# Patient Record
Sex: Female | Born: 1975 | Race: White | Hispanic: No | Marital: Married | Smoking: Never smoker
Health system: Southern US, Community
[De-identification: ages and names within clinical notes are randomized; demographics above are authoritative.]

## PROBLEM LIST (undated history)

## (undated) DIAGNOSIS — Z8719 Personal history of other diseases of the digestive system: Secondary | ICD-10-CM

## (undated) DIAGNOSIS — K219 Gastro-esophageal reflux disease without esophagitis: Secondary | ICD-10-CM

## (undated) DIAGNOSIS — E559 Vitamin D deficiency, unspecified: Secondary | ICD-10-CM

## (undated) DIAGNOSIS — D649 Anemia, unspecified: Secondary | ICD-10-CM

## (undated) DIAGNOSIS — Z87898 Personal history of other specified conditions: Secondary | ICD-10-CM

## (undated) HISTORY — PX: NASAL ENDOSCOPY: SHX286

## (undated) HISTORY — PX: NO PAST SURGERIES: SHX2092

## (undated) SURGERY — Surgical Case
Anesthesia: *Unknown

---

## 2003-01-10 ENCOUNTER — Ambulatory Visit (HOSPITAL_COMMUNITY): Admission: RE | Admit: 2003-01-10 | Discharge: 2003-01-10 | Payer: Self-pay | Admitting: Gastroenterology

## 2003-11-20 ENCOUNTER — Other Ambulatory Visit: Admission: RE | Admit: 2003-11-20 | Discharge: 2003-11-20 | Payer: Self-pay | Admitting: Obstetrics and Gynecology

## 2004-03-01 ENCOUNTER — Ambulatory Visit (HOSPITAL_COMMUNITY): Admission: RE | Admit: 2004-03-01 | Discharge: 2004-03-01 | Payer: Self-pay | Admitting: Chiropractic Medicine

## 2004-12-02 ENCOUNTER — Other Ambulatory Visit: Admission: RE | Admit: 2004-12-02 | Discharge: 2004-12-02 | Payer: Self-pay | Admitting: Obstetrics and Gynecology

## 2005-12-29 ENCOUNTER — Other Ambulatory Visit: Admission: RE | Admit: 2005-12-29 | Discharge: 2005-12-29 | Payer: Self-pay | Admitting: Obstetrics and Gynecology

## 2006-02-20 ENCOUNTER — Ambulatory Visit: Payer: Self-pay | Admitting: Internal Medicine

## 2006-02-28 ENCOUNTER — Encounter: Payer: Self-pay | Admitting: Cardiovascular Disease

## 2006-02-28 ENCOUNTER — Ambulatory Visit: Payer: Self-pay

## 2006-05-04 ENCOUNTER — Ambulatory Visit: Payer: Self-pay | Admitting: Internal Medicine

## 2006-05-18 ENCOUNTER — Ambulatory Visit (HOSPITAL_COMMUNITY): Admission: RE | Admit: 2006-05-18 | Discharge: 2006-05-18 | Payer: Self-pay | Admitting: Obstetrics and Gynecology

## 2008-10-03 ENCOUNTER — Inpatient Hospital Stay (HOSPITAL_COMMUNITY): Admission: AD | Admit: 2008-10-03 | Discharge: 2008-10-03 | Payer: Self-pay | Admitting: Obstetrics and Gynecology

## 2008-12-16 ENCOUNTER — Inpatient Hospital Stay (HOSPITAL_COMMUNITY): Admission: AD | Admit: 2008-12-16 | Discharge: 2008-12-20 | Payer: Self-pay | Admitting: Obstetrics and Gynecology

## 2008-12-22 ENCOUNTER — Ambulatory Visit: Admission: RE | Admit: 2008-12-22 | Discharge: 2008-12-22 | Payer: Self-pay | Admitting: Obstetrics and Gynecology

## 2008-12-23 ENCOUNTER — Ambulatory Visit: Admission: RE | Admit: 2008-12-23 | Discharge: 2008-12-23 | Payer: Self-pay | Admitting: Obstetrics and Gynecology

## 2010-03-12 ENCOUNTER — Ambulatory Visit (HOSPITAL_COMMUNITY): Admission: RE | Admit: 2010-03-12 | Discharge: 2010-03-12 | Payer: Self-pay | Admitting: Obstetrics and Gynecology

## 2010-06-08 ENCOUNTER — Inpatient Hospital Stay (HOSPITAL_COMMUNITY): Admission: AD | Admit: 2010-06-08 | Discharge: 2010-06-09 | Payer: Self-pay | Admitting: Obstetrics and Gynecology

## 2010-10-27 LAB — CBC
Hemoglobin: 11.6 g/dL — ABNORMAL LOW (ref 12.0–15.0)
RBC: 3.58 MIL/uL — ABNORMAL LOW (ref 3.87–5.11)

## 2010-10-27 LAB — RPR: RPR Ser Ql: NONREACTIVE

## 2010-10-30 LAB — RH IMMUNE GLOBULIN WORKUP (NOT WOMEN'S HOSP): ABO/RH(D): A NEG

## 2010-11-23 LAB — CBC
HCT: 28.3 % — ABNORMAL LOW (ref 36.0–46.0)
MCHC: 34.3 g/dL (ref 30.0–36.0)
MCV: 95.8 fL (ref 78.0–100.0)
Platelets: 153 10*3/uL (ref 150–400)
Platelets: 171 10*3/uL (ref 150–400)
RDW: 13 % (ref 11.5–15.5)
RDW: 13.1 % (ref 11.5–15.5)

## 2010-11-23 LAB — RPR: RPR Ser Ql: NONREACTIVE

## 2010-11-30 LAB — RH IMMUNE GLOBULIN WORKUP (NOT WOMEN'S HOSP): Antibody Screen: NEGATIVE
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
## 2010-12-28 NOTE — Discharge Summary (Signed)
NAMEARABELL, NERIA NO.:  [id number]   MEDICAL RECORD NO.:  [id number]          PATIENT TYPE:  INP   LOCATION:  9127                          [facility]ILITY:  [facility]   PHYSICIAN:  Crist Fat. Rivard, M.D. DATE OF BIRTH:  1975-09-18   DATE OF ADMISSION:  12/16/2008  DATE OF DISCHARGE:  12/20/2008                               DISCHARGE SUMMARY   ADMISSION DIAGNOSES:  1. Intrauterine pregnancy at 29 and 6/7 weeks.  2. Premature rupture of membranes.   DISCHARGE DIAGNOSES:  1. Intrauterine pregnancy at 40 and 1/7 weeks, delivered.  2. Maternal exhaustion.  3. Failure to descend.   PROCEDURES:  1. Vacuum-assisted vaginal delivery.  2. Midline episiotomy.   HOSPITAL COURSE:  Ms. Charlie is a 35 year old gravida 1, para 0 who was  admitted at 61 and 6/7 weeks' gestation with premature rupture of  membranes early in the morning on the day of admission.  The patient's  amniotic fluid was clear.  The patient with group B strep negative.  The  patient had desired a nonintervention at birth.  The patient is followed  by the [facility].  The patient's  pregnancy remarkable for:  1. GBS negative.  2. Rh negative.  3. Rubella equivocal.  4. History of infertility with spontaneous conception.   Upon admission, fetal heart rate was reactive.  The patient's cervical  exam, finger tip 60%, -2, and rupture of membranes with clear fluid was  confirmed.  The risks, benefits, and alternatives of the proceeding with  cervical ripening and induction of labor were discussed with the patient  and her husband and they desired to proceed initially with Cervidil for  cervical ripening.  Cervidil was placed in the posterior fornix on the  day of admission.  The patient began having spontaneous contractions  with Cervidil and artificial rupture of membranes of the forebag was  performed on Dec 17, 2008, in the early morning hours.  The patient was  afebrile at  that point in time.   At 24 hours post rupture of membranes, the patient with uterine  contractions every 3-4 minutes.  The patient continued to leak clear  fluid.  Fetal heart rate remained reassuring and reactive.  Cervical  exam had progressed to 3 cm, 80% effaced,  and -1 station.  At that  time, the patient and her husband elected to defer Pitocin.  After  risks, benefits, alternatives of augmentation were discussed with the  patient in relation to prolonged rupture of membranes.  The patient  continued to make progress in labor throughout the day on Dec 17, 2008.  The patient progressed to 6-7 cm, 90% effaced, and then elected to  proceed with epidural.  After epidural was placed, Pitocin per protocol  was started for labor augmentation.  This is due to the fact that the  patient without any change over a 2-1/2 period of time.   The patient continued to make slow progress in Labor and was found to be  completely dilated at 9:15 p.m. on Dec 17, 2008.  The patient  was allowed  to have initial phase of stage 2 labor passively and labor down as the  patient was afebrile and the patient's fetus was reactive.  The patient  began pushing at 10:30 p.m. on Dec 17, 2008.  The patient continued with  Pitocin augmentation throughout pushing.  After 2 hours of the pushing  were complete, little descend, and fetal station despite maternal  efforts.  Consent was obtained with Dr. Normand Sloop.  At that time, it was  decided the patient would continue to push for another hour and then  reassess.   The patient began to push for another hour.  Dr. Normand Sloop assessed the  patient and offered the patient a vacuum-assisted vaginal delivery  versus C-section.  At that time, fetus was noted to be at 3+ station  with caput present.  The patient and husband elected to proceed with  vacuum-assisted vaginal delivery.  Vacuum was placed and with 4 pulls  without popoff and infant was delivered at 2:45 on Dec 18, 2008.   A  midline episiotomy was performed with the second-degree extension noted.  The remainder of the delivery was uncomplicated.  Estimated blood loss  300 mL.  Episiotomy was repaired in the normal fashion with epidural  anesthesia and the patient tolerated without difficulty.   On postpartum day #1, the patient ambulating, voiding, and tolerating  p.o. liquids and solids without difficulty.  Breast feeding was going  well.  The patient's hemoglobin was noted to be 9.9, decreased from 14.2  prior to delivery.  The patient was hemodynamically stable with no  orthostatic symptoms.  The patient with well-controlled perineal pain.  The patient remained afebrile.   On postpartum day #2, the patient again was stable and without  complaints.  It was felt the patient had received full benefit of her  hospital stay and was discharged to home.  The patient undecided  regarding contraception.  The patient did exhibit some very mild baby  blues on postpartum day #2.   DISCHARGE INSTRUCTIONS:  Per [facility] handout.  The patient  was also given careful instructions regarding mood changes in the  postpartum period.   DISCHARGE MEDICATIONS:  1. Motrin 600 mg p.o. q.6 h. p.r.n. pain.  2. Tylox 1-2 every 4-6 hours as needed for pain.  3. Integra Plus iron supplement 1 tablet daily.   DISCHARGE FOLLOWUP:  Will occur in 6 weeks at [facility].       Rhona Leavens, CNM      Crist Fat Rivard, M.D.  Electronically Signed    NOS/MEDQ  D:  12/20/2008  T:  12/20/2008  Job:  [id number]

## 2010-12-28 NOTE — Op Note (Signed)
Melody Powers, Melody Powers                 ACCOUNT NO.:  [id number]   MEDICAL RECORD NO.:  [id number]          PATIENT TYPE:  INP   LOCATION:  9127                          [facility]  [facility]   PHYSICIAN:  Naima A. Dillard, M.D. DATE OF BIRTH:  Oct 24, 1975   DATE OF PROCEDURE:  12/18/2008  DATE OF DISCHARGE:                               OPERATIVE REPORT   I was called to see the patient secondary to failure to progress and  maternal fatigue. s The patient was pushing for 3-1/2 hours.  She had  failure to progress and maternal fatigue.  The patient was offered a  vacuum-assisted vaginal delivery versus C-section.  She was told the  risk of vacuum-assisted vaginal delivery were but not limited to  lacerations on the scalp, cephalohematoma, intraventricular hemorrhage.  Risks of C-section are bleeding; infection; damage to internal organ  system, bowel, bladder, or major blood vessels.  The patient decided on  the vacuum-assisted vaginal delivery.  Again, the risks were told again  to her and her husband.  Her Foley catheter was removed, the baby was in  the left occiput anterior position at +3 station with some caput.   A Mityvac was placed in the correct position at 2:38 a.m.  The suction  and pulls were in the green zone starting at 2:43 p.m.  There were 4  pulls in the green zone.  No pop-offs.  The suction was released in  between contractions.  The infant delivered at 2:45 a.m.  The head  delivered through perineum.  Midline episiotomy with second-degree  extension was noted.  There was no nuchal cord.  Without difficulty,  placenta delivered intact.  The second-degree extension was repaired  with 2-0 chromic.  Estimated blood loss was 300 mL.  I did give arterial  and venous blood gases but the technician said it was too little blood.  Mother recovered normal in labor and delivery.  So, the Mityvac was used  for a total of 4 pulls and no pop-offs for a total of 2 minutes.  The  vacuum  was  appliedon the baby's head at 2:38  but no suction was  applied until 02:43 a.m.      Naima A. Normand Sloop, M.D.  Electronically Signed     NAD/MEDQ  D:  12/18/2008  T:  12/18/2008  Job:  [id number]

## 2010-12-31 NOTE — Op Note (Signed)
NAME:  Melody Powers, Melody Powers                           ACCOUNT NO.:  [id number]   MEDICAL RECORD NO.:  [id number]                   PATIENT TYPE:  AMB   LOCATION:  ENDO                                 FACILITY:  [facility]   PHYSICIAN:  Anselmo Rod, M.D.               DATE OF BIRTH:  [birth year]   DATE OF PROCEDURE:  01/10/2003  DATE OF DISCHARGE:                                 OPERATIVE REPORT   PROCEDURE PERFORMED:  Esophagogastroduodenoscopy.   ENDOSCOPIST:  Charna Elizabeth, M.D.   INSTRUMENT USED:  Olympus video panendoscope.   INDICATIONS FOR PROCEDURE:  Dysphagia with stricture seen on barium swallow  at the gastroesophageal junction.  Questionable spasm with early achalasia.   PREPROCEDURE PREPARATION:  Informed consent was procured from the patient.  The patient was fasted for eight hours prior to the procedure.   PREPROCEDURE PHYSICAL:  The patient had stable vital signs.  Neck supple,  chest clear to auscultation.  S1, S2 regular.  Abdomen soft with normal  bowel sounds.   DESCRIPTION OF PROCEDURE:  The patient was placed in the left lateral  decubitus position and sedated with 80 mg of Demerol and 8 mg of Versed  intravenously.  Once the patient was adequately sedated and maintained on  low-flow oxygen and continuous cardiac monitoring, the Olympus video  panendoscope was advanced through the mouth piece over the tongue into the  esophagus under direct vision.  The entire esophagus appeared widely patent  with no evidence of ring, stricture, masses, esophagitis or Barrett's  mucosa.  The gastroesophageal junction was widely patent.  No spasm or  esophagitis was noted. There was no evidence of achalasia.  The scope was  passed without any difficulty to the stomach.  Retroflexion in the high  cardia revealed no abnormalities, no hiatal hernia was seen.  The proximal  small bowel including the duodenal bulb appeared normal. There was some  residual debris in the stomach and  multiple washes were done to facilitate  adequate visualization of the gastric mucosa.  No ulcers, erosions, masses  or polyps were seen.  The scope was then advanced to the stomach.   IMPRESSION:  1. Widely patent esophagus with no stricture, spasm or achalasia noted.  2. Normal-appearing gastric mucosa and proximal small bowel.    RECOMMENDATIONS:  1. Continue proton pump inhibitor.  2. Avoid all nonsteroidals including aspirin for now.  3. Outpatient follow-up in the next two weeks to consider manometry if     symptoms persist.                                                Anselmo Rod, M.D.    JNM/MEDQ  D:  01/10/2003  T:  01/10/2003  Job:  [id number]  cc:   Tammy R. Collins [place], M.D.  [address]  [place]  [place] [address]  Fax: [phone]

## 2019-08-06 ENCOUNTER — Other Ambulatory Visit: Payer: Self-pay | Admitting: Obstetrics and Gynecology

## 2019-08-06 DIAGNOSIS — R928 Other abnormal and inconclusive findings on diagnostic imaging of breast: Secondary | ICD-10-CM

## 2019-08-13 ENCOUNTER — Ambulatory Visit
Admission: RE | Admit: 2019-08-13 | Discharge: 2019-08-13 | Disposition: A | Discharge status: Home / Self Care | Payer: BC Managed Care – PPO | Source: Ambulatory Visit | Attending: Obstetrics and Gynecology | Admitting: Obstetrics and Gynecology

## 2019-08-13 ENCOUNTER — Other Ambulatory Visit: Payer: Self-pay

## 2019-08-13 DIAGNOSIS — R928 Other abnormal and inconclusive findings on diagnostic imaging of breast: Secondary | ICD-10-CM

## 2019-11-08 ENCOUNTER — Other Ambulatory Visit: Payer: Self-pay | Admitting: Gastroenterology

## 2019-11-08 DIAGNOSIS — R1310 Dysphagia, unspecified: Secondary | ICD-10-CM

## 2019-11-29 ENCOUNTER — Other Ambulatory Visit: Payer: BC Managed Care – PPO

## 2020-01-14 ENCOUNTER — Other Ambulatory Visit: Payer: Self-pay | Admitting: Obstetrics and Gynecology

## 2020-01-17 NOTE — H&P (Addendum)
Melody Powers is a 44 y.o.  female, P: 2-0-0-2 presenting for endometrial ablation because of menorrhagia, anemia and a history of endometrial polyp. For five years the patient has endured heavy menstrual periods that last for 7 days with the need to change her pad hourly (with clots) on 4 of those days. Fortunately she only has mild cramping. Her philosophy has been to have minimal intervention for her period witht the hope that it would improve with time, therefore she has not used any medical management for the flow. She denies any changes in bowel or bladder function, dyspareunia, vaginitis symptoms or pelvic pain. During these bleeding episodes the patient will experience extreme malaise and some disruption in her bowel function. An endometrial biopsy in February 2021 returned benign endometrioid polyp and pelvic ultrasound: an anteverted uterus: 8.5 cm from fundus to external os: 5.58 x 4.10 x 4.61 cm, endometrium dynamic with normal morphology-11.24 mm; right ovary- 3.10 cm and left ovary-3.0 cm. A review of both medical and surgical management options were given to the patient however she has chosen to proceed with endometrial ablation. She is aware that ablation is not a method of contraception.  She currently uses condoms  and her husband is planning a vasectomy.  Past Medical History  OB History: G: 2; P: 2-0-0-2; SVB: 2010 and 2011  GYN History: menarche: 44 YO;    LMP:  12/22/2019;    Contraception: condoms; Denies history of abnormal PAP smear.  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   ROS: Admits to reading glasses and occasional constipation but;  denies headache, vision changes, nasal congestion, dysphagia, tinnitus, dizziness, hoarseness, cough,  chest pain, shortness of breath, nausea, vomiting, diarrhea, urinary frequency, urgency  dysuria, hematuria, vaginitis symptoms, pelvic pain, swelling of joints,easy bruising,  myalgias, arthralgias, skin rashes, unexplained weight loss and except as is mentioned in the history of present illness, patient's review of systems is otherwise negative.     Physical Exam  Bp: 100/68;  P: 86 bpm;  R: 16;  Temperature: 97.1 degrees F temporally;  Weight: 138 lbs.;  Height: 5\' 6"   BMI: 22.3  Neck: supple without masses or thyromegaly Lungs: clear to auscultation Heart: regular rate and rhythm Abdomen: soft, non-tender and no organomegaly Pelvic:EGBUS- wnl; vagina-normal rugae; uterus-normal size, cervix without lesions or motion tenderness; adnexae-no tenderness or masses Extremities:  no clubbing, cyanosis or edema   Assesment:  Menorrhagia                       Anemia                       History of Endometrial Polyp   Disposition:  A discussion was held with patient regarding the indication for her procedure(s) along with the risks, which include but are not limited to: reaction to anesthesia, damage to adjacent organs (to include uterine perforation), infection and excessive bleeding. The patient verbalized understanding of these risks and has consented to proceed with Hysteroscopy with  Hydrothermal Ablation at [facility] on February 07, 2020 at 7: 30 a.m.   CSN# [id number]   [place] J. Florene Glen, PA-C  for Dr.Joselle Deeds A.. Anuradha Chabot   Date of Initial H&P: 01/17/20  History reviewed, patient examined, no change in status, stable for surgery.

## 2020-01-31 ENCOUNTER — Encounter (HOSPITAL_BASED_OUTPATIENT_CLINIC_OR_DEPARTMENT_OTHER): Payer: Self-pay | Admitting: Obstetrics and Gynecology

## 2020-01-31 ENCOUNTER — Other Ambulatory Visit: Payer: Self-pay

## 2020-01-31 NOTE — Progress Notes (Signed)
Spoke w/ via phone for pre-op interview---pt Lab needs dos----  Urine poct, type and screen             COVID test ------02-04-2020 at 850 Arrive at -------530 am 02-07-2020 NPO after ------midnight Medications to take morning of surgery -----none Diabetic medication -----n/a Patient Special Instructions -----none Pre-Op special Istructions -----none Patient verbalized understanding of instructions that were given at this phone interview. Patient denies shortness of breath, chest pain, fever, cough a this phone interview.

## 2020-02-04 ENCOUNTER — Other Ambulatory Visit (HOSPITAL_COMMUNITY)
Admission: RE | Admit: 2020-02-04 | Discharge: 2020-02-04 | Disposition: A | Discharge status: Home / Self Care | Payer: No Typology Code available for payment source | Source: Ambulatory Visit | Attending: Obstetrics and Gynecology | Admitting: Obstetrics and Gynecology

## 2020-02-04 DIAGNOSIS — Z01812 Encounter for preprocedural laboratory examination: Secondary | ICD-10-CM | POA: Insufficient documentation

## 2020-02-04 DIAGNOSIS — Z20822 Contact with and (suspected) exposure to covid-19: Secondary | ICD-10-CM | POA: Insufficient documentation

## 2020-02-04 LAB — SARS CORONAVIRUS 2 (TAT 6-24 HRS): SARS Coronavirus 2: NEGATIVE

## 2020-02-06 NOTE — Anesthesia Preprocedure Evaluation (Addendum)
Anesthesia Evaluation  Patient identified by MRN, date of birth, ID band Patient awake    Reviewed: Allergy & Precautions, NPO status , Patient's Chart, lab work & pertinent test results  History of Anesthesia Complications Negative for: history of anesthetic complications  Airway Mallampati: I  TM Distance: >3 FB Neck ROM: Full    Dental  (+) Dental Advisory Given, Teeth Intact   Pulmonary neg pulmonary ROS,    Pulmonary exam normal        Cardiovascular negative cardio ROS Normal cardiovascular exam     Neuro/Psych negative neurological ROS  negative psych ROS   GI/Hepatic Neg liver ROS, GERD  Controlled,  Endo/Other  negative endocrine ROS  Renal/GU negative Renal ROS     Musculoskeletal negative musculoskeletal ROS (+)   Abdominal   Peds  Hematology negative hematology ROS (+)   Anesthesia Other Findings Covid test negative   Reproductive/Obstetrics                            Anesthesia Physical Anesthesia Plan  ASA: I  Anesthesia Plan: General   Post-op Pain Management:    Induction: Intravenous  PONV Risk Score and Plan: 3 and Treatment may vary due to age or medical condition, Ondansetron, Dexamethasone and Midazolam  Airway Management Planned: LMA  Additional Equipment: None  Intra-op Plan:   Post-operative Plan: Extubation in OR  Informed Consent: I have reviewed the patients History and Physical, chart, labs and discussed the procedure including the risks, benefits and alternatives for the proposed anesthesia with the patient or authorized representative who has indicated his/her understanding and acceptance.     Dental advisory given  Plan Discussed with: CRNA and Anesthesiologist  Anesthesia Plan Comments:        Anesthesia Quick Evaluation

## 2020-02-07 ENCOUNTER — Ambulatory Visit (HOSPITAL_BASED_OUTPATIENT_CLINIC_OR_DEPARTMENT_OTHER): Payer: No Typology Code available for payment source | Admitting: Anesthesiology

## 2020-02-07 ENCOUNTER — Encounter (HOSPITAL_BASED_OUTPATIENT_CLINIC_OR_DEPARTMENT_OTHER): Payer: Self-pay | Admitting: Obstetrics and Gynecology

## 2020-02-07 ENCOUNTER — Ambulatory Visit (HOSPITAL_COMMUNITY)
Admission: RE | Admit: 2020-02-07 | Discharge: 2020-02-07 | Disposition: A | Discharge status: Home / Self Care | Payer: No Typology Code available for payment source | Attending: Obstetrics and Gynecology | Admitting: Obstetrics and Gynecology

## 2020-02-07 ENCOUNTER — Encounter (HOSPITAL_BASED_OUTPATIENT_CLINIC_OR_DEPARTMENT_OTHER)
Admission: RE | Disposition: A | Discharge status: Home / Self Care | Payer: Self-pay | Source: Home / Self Care | Attending: Obstetrics and Gynecology

## 2020-02-07 DIAGNOSIS — Z881 Allergy status to other antibiotic agents status: Secondary | ICD-10-CM | POA: Diagnosis not present

## 2020-02-07 DIAGNOSIS — N920 Excessive and frequent menstruation with regular cycle: Secondary | ICD-10-CM | POA: Insufficient documentation

## 2020-02-07 HISTORY — DX: Vitamin D deficiency, unspecified: E55.9

## 2020-02-07 HISTORY — DX: Anemia, unspecified: D64.9

## 2020-02-07 HISTORY — PX: DILITATION & CURRETTAGE/HYSTROSCOPY WITH HYDROTHERMAL ABLATION: SHX5570

## 2020-02-07 HISTORY — DX: Gastro-esophageal reflux disease without esophagitis: K21.9

## 2020-02-07 HISTORY — DX: Personal history of other diseases of the digestive system: Z87.19

## 2020-02-07 HISTORY — DX: Personal history of other specified conditions: Z87.898

## 2020-02-07 LAB — ABO/RH: ABO/RH(D): A NEG

## 2020-02-07 LAB — TYPE AND SCREEN
ABO/RH(D): A NEG
Antibody Screen: NEGATIVE

## 2020-02-07 LAB — POCT PREGNANCY, URINE: Preg Test, Ur: NEGATIVE

## 2020-02-07 SURGERY — DILATATION & CURETTAGE/HYSTEROSCOPY WITH HYDROTHERMAL ABLATION
Anesthesia: General | Site: Vagina

## 2020-02-07 MED ORDER — MIDAZOLAM HCL 2 MG/2ML IJ SOLN
INTRAMUSCULAR | Status: AC
  Filled 2020-02-07: qty 2

## 2020-02-07 MED ORDER — PROPOFOL 10 MG/ML IV BOLUS
INTRAVENOUS | Status: AC
  Filled 2020-02-07: qty 20

## 2020-02-07 MED ORDER — MIDAZOLAM HCL 5 MG/5ML IJ SOLN
INTRAMUSCULAR | Status: DC | PRN
  Administered 2020-02-07: 2 mg via INTRAVENOUS

## 2020-02-07 MED ORDER — LIDOCAINE HCL 2 % IJ SOLN
INTRAMUSCULAR | Status: DC | PRN
  Administered 2020-02-07: 10 mL

## 2020-02-07 MED ORDER — FENTANYL CITRATE (PF) 100 MCG/2ML IJ SOLN
INTRAMUSCULAR | Status: DC | PRN
  Administered 2020-02-07 (×4): 25 ug via INTRAVENOUS

## 2020-02-07 MED ORDER — LIDOCAINE 2% (20 MG/ML) 5 ML SYRINGE
INTRAMUSCULAR | Status: AC
  Filled 2020-02-07: qty 5

## 2020-02-07 MED ORDER — SODIUM CHLORIDE 0.9 % IR SOLN
Status: DC | PRN
  Administered 2020-02-07: 3000 mL

## 2020-02-07 MED ORDER — DEXAMETHASONE SODIUM PHOSPHATE 10 MG/ML IJ SOLN
INTRAMUSCULAR | Status: AC
  Filled 2020-02-07: qty 1

## 2020-02-07 MED ORDER — FENTANYL CITRATE (PF) 100 MCG/2ML IJ SOLN
INTRAMUSCULAR | Status: AC
  Filled 2020-02-07: qty 2

## 2020-02-07 MED ORDER — OXYCODONE-ACETAMINOPHEN 2.5-325 MG PO TABS: 1.0000 | ORAL_TABLET | ORAL | 0 refills | Status: AC | PRN

## 2020-02-07 MED ORDER — DEXAMETHASONE SODIUM PHOSPHATE 10 MG/ML IJ SOLN
INTRAMUSCULAR | Status: DC | PRN
  Administered 2020-02-07: 5 mg via INTRAVENOUS

## 2020-02-07 MED ORDER — ONDANSETRON HCL 4 MG/2ML IJ SOLN
INTRAMUSCULAR | Status: AC
  Filled 2020-02-07: qty 2

## 2020-02-07 MED ORDER — IBUPROFEN 600 MG PO TABS: 600.0000 mg | ORAL_TABLET | Freq: Four times a day (QID) | ORAL | 0 refills | Status: AC | PRN

## 2020-02-07 MED ORDER — LIDOCAINE 2% (20 MG/ML) 5 ML SYRINGE
INTRAMUSCULAR | Status: DC | PRN
  Administered 2020-02-07: 60 mg via INTRAVENOUS

## 2020-02-07 MED ORDER — LACTATED RINGERS IV SOLN: INTRAVENOUS | Status: DC

## 2020-02-07 MED ORDER — PROPOFOL 10 MG/ML IV BOLUS
INTRAVENOUS | Status: DC | PRN
  Administered 2020-02-07: 150 mg via INTRAVENOUS

## 2020-02-07 MED ORDER — ONDANSETRON HCL 4 MG/2ML IJ SOLN
INTRAMUSCULAR | Status: DC | PRN
  Administered 2020-02-07: 4 mg via INTRAVENOUS

## 2020-02-07 SURGICAL SUPPLY — 14 items
CANISTER SUCT 3000ML PPV (MISCELLANEOUS) ×1 IMPLANT
CATH ROBINSON RED A/P 16FR (CATHETERS) ×3 IMPLANT
DILATOR CANAL MILEX (MISCELLANEOUS) IMPLANT
GLOVE BIO SURGEON STRL SZ 6.5 (GLOVE) ×2 IMPLANT
GLOVE BIO SURGEONS STRL SZ 6.5 (GLOVE) ×1
GLOVE BIOGEL PI IND STRL 7.0 (GLOVE) ×2 IMPLANT
GLOVE BIOGEL PI INDICATOR 7.0 (GLOVE) ×4
GOWN STRL REUS W/ TWL LRG LVL3 (GOWN DISPOSABLE) ×2 IMPLANT
GOWN STRL REUS W/TWL LRG LVL3 (GOWN DISPOSABLE) ×6
PACK VAGINAL MINOR WOMEN LF (CUSTOM PROCEDURE TRAY) ×3 IMPLANT
PAD OB MATERNITY 4.3X12.25 (PERSONAL CARE ITEMS) ×3 IMPLANT
SET GENESYS HTA PROCERVA (MISCELLANEOUS) ×3 IMPLANT
TOWEL OR 17X26 10 PK STRL BLUE (TOWEL DISPOSABLE) ×3 IMPLANT
UNDERPAD 30X36 HEAVY ABSORB (UNDERPADS AND DIAPERS) ×3 IMPLANT

## 2020-02-07 NOTE — Op Note (Signed)
Preop Diagnosis: Menorrhagia   Postop Diagnosis: Menorrhagia   Procedure: DILATATION & CURETTAGE/HYSTEROSCOPY WITH HYDROTHERMAL ABLATION   Anesthesia: Choice   Anesthesiologist: No responsible provider has been recorded for the case.   Attending: Jaymes Graff, MD   Assistant: none  Findings: normal cavity 8 week size.    Pathology: [facility]  Fluids:  715 cc crystalloid  UOP: 600 cc   EBL: minimal  Complications: none  Procedure: The patient was taken to the operating room after risks benefits and alternatives were discussed with patient, the patient verbalized understanding and consent signed and witnessed. The patient was placed under general anesthesia and prepped and draped in normal sterile fashion. A bivalve speculum was placed in the patient's vagina and the anterior lip of the cervix was grasped with a single-tooth tenaculum. The cervix was dilated for passage of the hysteroscope. The uterus sounded to 8.  A sharp currateage was done and [facility] sent to pathology.  The hysteroscope was introduced into the uterine cavity with findings as noted abov. The hysteroscope was reintroduced and hydrothermal ablation was performed without difficulty. The tenaculum and bivalve speculum were removed and there was good hemostasis at the tenaculum sites. Sponge lap and needle count was correct. The patient tolerated procedure well and was returned to the recovery room in good condition.

## 2020-02-07 NOTE — Transfer of Care (Signed)
Immediate Anesthesia Transfer of Care Note  Patient: Melody Powers  Procedure(s) Performed: DILATATION & CURETTAGE/HYSTEROSCOPY WITH HYDROTHERMAL ABLATION (N/A Vagina )  Patient Location: PACU  Anesthesia Type:General  Level of Consciousness: awake, alert , oriented and patient cooperative  Airway & Oxygen Therapy: Patient Spontanous Breathing and Patient connected to nasal cannula oxygen  Post-op Assessment: Report given to RN and Post -op Vital signs reviewed and stable  Post vital signs: Reviewed and stable  Last Vitals:  Vitals Value Taken Time  BP 107/68 02/07/20 0815  Temp 36.3 C 02/07/20 0815  Pulse 101 02/07/20 0816  Resp 13 02/07/20 0816  SpO2 100 % 02/07/20 0816    Last Pain:  Vitals:   02/07/20 0604  TempSrc: Oral  PainSc: 0-No pain      Patients Stated Pain Goal: 5 (02/07/20 0604)  Complications: No complications documented.

## 2020-02-07 NOTE — Discharge Instructions (Signed)
Endometrial Ablation Endometrial ablation is a procedure that destroys the thin inner layer of the lining of the uterus (endometrium). This procedure may be done:  To stop heavy periods.  To stop bleeding that is causing anemia.  To control irregular bleeding.  To treat bleeding caused by small tumors (fibroids) in the endometrium. This procedure is often an alternative to major surgery, such as removal of the uterus and cervix (hysterectomy). As a result of this procedure:  You may not be able to have children. However, if you are premenopausal (you have not gone through menopause): ? You may still have a small chance of getting pregnant. ? You will need to use a reliable method of birth control after the procedure to prevent pregnancy.  You may stop having a menstrual period, or you may have only a small amount of bleeding during your period. Menstruation may return several years after the procedure. Tell a health care provider about:  Any allergies you have.  All medicines you are taking, including vitamins, herbs, eye drops, creams, and over-the-counter medicines.  Any problems you or family members have had with the use of anesthetic medicines.  Any blood disorders you have.  Any surgeries you have had.  Any medical conditions you have. What are the risks? Generally, this is a safe procedure. However, problems may occur, including:  A hole (perforation) in the uterus or bowel.  Infection of the uterus, bladder, or vagina.  Bleeding.  Damage to other structures or organs.  An air bubble in the lung (air embolus).  Problems with pregnancy after the procedure.  Failure of the procedure.  Decreased ability to diagnose cancer in the endometrium. What happens before the procedure?  You will have tests of your endometrium to make sure there are no pre-cancerous cells or cancer cells present.  You may have an ultrasound of the uterus.  You may be given medicines  to thin the endometrium.  Ask your health care provider about: ? Changing or stopping your regular medicines. This is especially important if you take diabetes medicines or blood thinners. ? Taking medicines such as aspirin and ibuprofen. These medicines can thin your blood. Do not take these medicines before your procedure if your doctor tells you not to.  Plan to have someone take you home from the hospital or clinic. What happens during the procedure?   You will lie on an exam table with your feet and legs supported as in a pelvic exam.  To lower your risk of infection: ? Your health care team will wash or sanitize their hands and put on germ-free (sterile) gloves. ? Your genital area will be washed with soap.  An IV tube will be inserted into one of your veins.  You will be given a medicine to help you relax (sedative).  A surgical instrument with a light and camera (resectoscope) will be inserted into your vagina and moved into your uterus. This allows your surgeon to see inside your uterus.  Endometrial tissue will be removed using one of the following methods: ? Radiofrequency. This method uses a radiofrequency-alternating electric current to remove the endometrium. ? Cryotherapy. This method uses extreme cold to freeze the endometrium. ? Heated-free liquid. This method uses a heated saltwater (saline) solution to remove the endometrium. ? Microwave. This method uses high-energy microwaves to heat up the endometrium and remove it. ? Thermal balloon. This method involves inserting a catheter with a balloon tip into the uterus. The balloon tip is filled  with heated fluid to remove the endometrium. The procedure may vary among health care providers and hospitals. What happens after the procedure?  Your blood pressure, heart rate, breathing rate, and blood oxygen level will be monitored until the medicines you were given have worn off.  As tissue healing occurs, you may notice  vaginal bleeding for 4-6 weeks after the procedure. You may also experience: ? Cramps. ? Thin, watery vaginal discharge that is light pink or brown in color. ? A need to urinate more frequently than usual. ? Nausea.  Do not drive for 24 hours if you were given a sedative.  Do not have sex or insert anything into your vagina until your health care provider approves. Summary  Endometrial ablation is done to treat the many causes of heavy menstrual bleeding.  The procedure may be done only after medications have been tried to control the bleeding.  Plan to have someone take you home from the hospital or clinic. This information is not intended to replace advice given to you by your health care provider. Make sure you discuss any questions you have with your health care provider. Document Revised: 01/16/2018 Document Reviewed: 08/18/2016 Elsevier Patient Education  2020 [employer].  Post Anesthesia Home Care Instructions  Activity: Get plenty of rest for the remainder of the day. A responsible individual must stay with you for 24 hours following the procedure.  For the next 24 hours, DO NOT: -Drive a car -Advertising copywriter -Drink alcoholic beverages -Take any medication unless instructed by your physician -Make any legal decisions or sign important papers.  Meals: Start with liquid foods such as gelatin or soup. Progress to regular foods as tolerated. Avoid greasy, spicy, heavy foods. If nausea and/or vomiting occur, drink only clear liquids until the nausea and/or vomiting subsides. Call your physician if vomiting continues.  Special Instructions/Symptoms: Your throat may feel dry or sore from the anesthesia or the breathing tube placed in your throat during surgery. If this causes discomfort, gargle with warm salt water. The discomfort should disappear within 24 hours.  If you had a scopolamine patch placed behind your ear for the management of post- operative nausea and/or  vomiting:  1. The medication in the patch is effective for 72 hours, after which it should be removed.  Wrap patch in a tissue and discard in the trash. Wash hands thoroughly with soap and water. 2. You may remove the patch earlier than 72 hours if you experience unpleasant side effects which may include dry mouth, dizziness or visual disturbances. 3. Avoid touching the patch. Wash your hands with soap and water after contact with the patch.

## 2020-02-07 NOTE — Anesthesia Postprocedure Evaluation (Signed)
Anesthesia Post Note  Patient: Melody Powers  Procedure(s) Performed: DILATATION & CURETTAGE/HYSTEROSCOPY WITH HYDROTHERMAL ABLATION (N/A Vagina )     Patient location during evaluation: PACU Anesthesia Type: General Level of consciousness: awake and alert Pain management: pain level controlled Vital Signs Assessment: post-procedure vital signs reviewed and stable Respiratory status: spontaneous breathing, nonlabored ventilation and respiratory function stable Cardiovascular status: blood pressure returned to baseline and stable Postop Assessment: no apparent nausea or vomiting Anesthetic complications: no   No complications documented.  Last Vitals:  Vitals:   02/07/20 0845 02/07/20 0925  BP: (!) 114/46 (!) 117/54  Pulse: 85 62  Resp: 13 14  Temp:  (!) 36.3 C  SpO2: 100% 100%    Last Pain:  Vitals:   02/07/20 0925  TempSrc:   PainSc: 5                  Beryle Lathe

## 2020-02-07 NOTE — Anesthesia Procedure Notes (Signed)
Procedure Name: LMA Insertion Date/Time: 02/07/2020 7:26 AM Performed by: Elyn Peers, CRNA Pre-anesthesia Checklist: Patient identified, Emergency Drugs available, Suction available, Patient being monitored and Timeout performed Patient Re-evaluated:Patient Re-evaluated prior to induction Oxygen Delivery Method: Circle system utilized Preoxygenation: Pre-oxygenation with 100% oxygen Induction Type: IV induction Ventilation: Mask ventilation without difficulty LMA: LMA inserted LMA Size: 4.0 Number of attempts: 1 Placement Confirmation: positive ETCO2 and breath sounds checked- equal and bilateral Tube secured with: Tape Dental Injury: Teeth and Oropharynx as per pre-operative assessment

## 2020-02-10 ENCOUNTER — Encounter (HOSPITAL_BASED_OUTPATIENT_CLINIC_OR_DEPARTMENT_OTHER): Payer: Self-pay | Admitting: Obstetrics and Gynecology

## 2020-02-10 LAB — SURGICAL PATHOLOGY

## 2021-05-31 IMAGING — MG MM DIGITAL DIAGNOSTIC UNILAT*L* W/ TOMO W/ CAD
4 series · 4 of 12 positions shown · non-contrast
Comparison: Chest 16 20/20 and earlier

CLINICAL DATA: Patient returns after screening study for evaluation
of a possible LEFT breast mass.

EXAM:
DIGITAL DIAGNOSTIC LEFT MAMMOGRAM WITH CAD AND TOMO
ULTRASOUND LEFT BREAST

[L CC synth-2D]
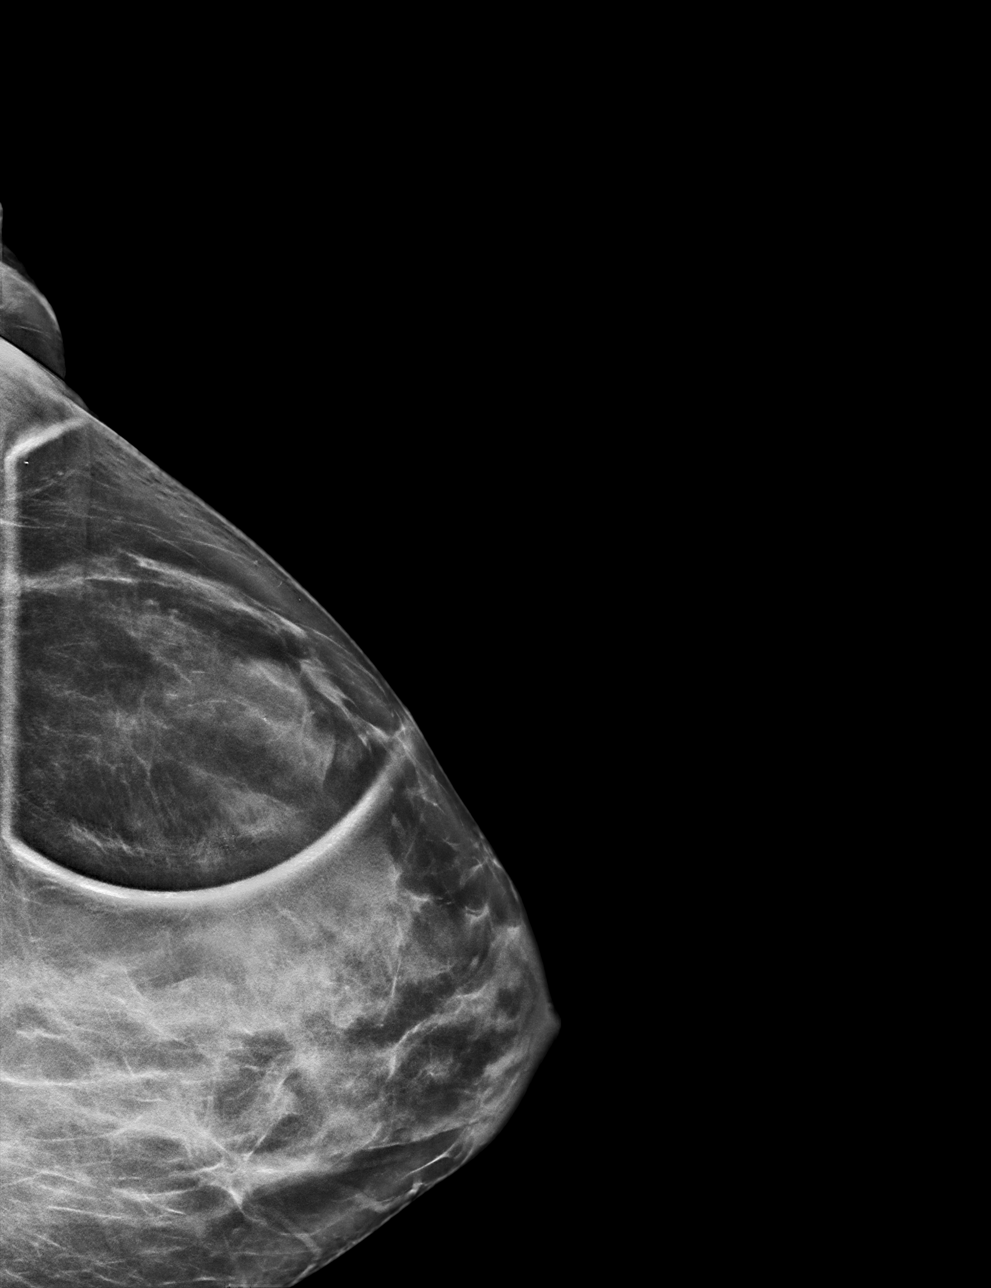

[L MLO synth-2D]
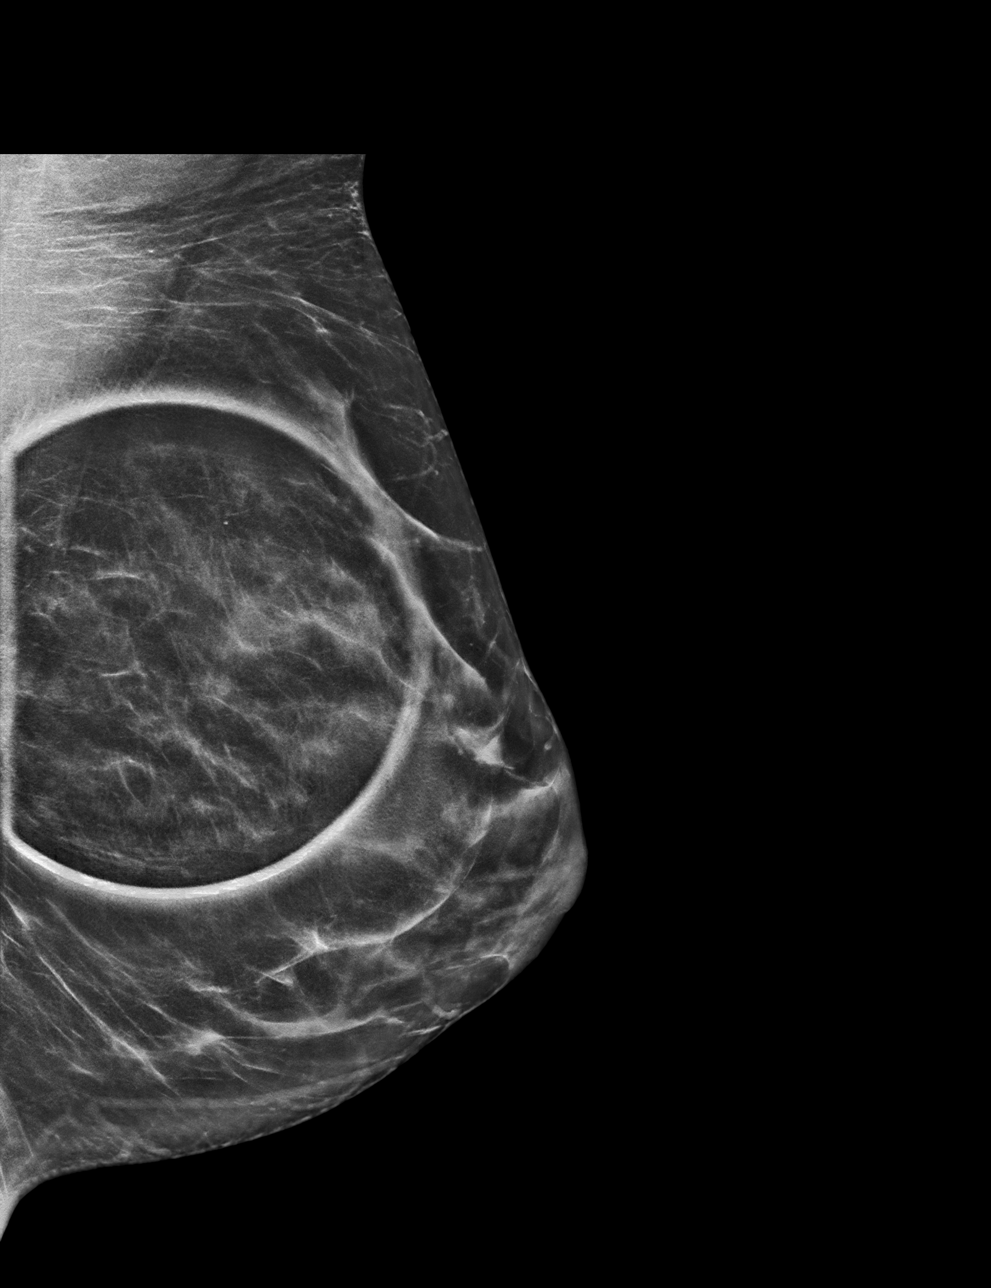

[L MLO tomo · tomo slice 35/70.0]
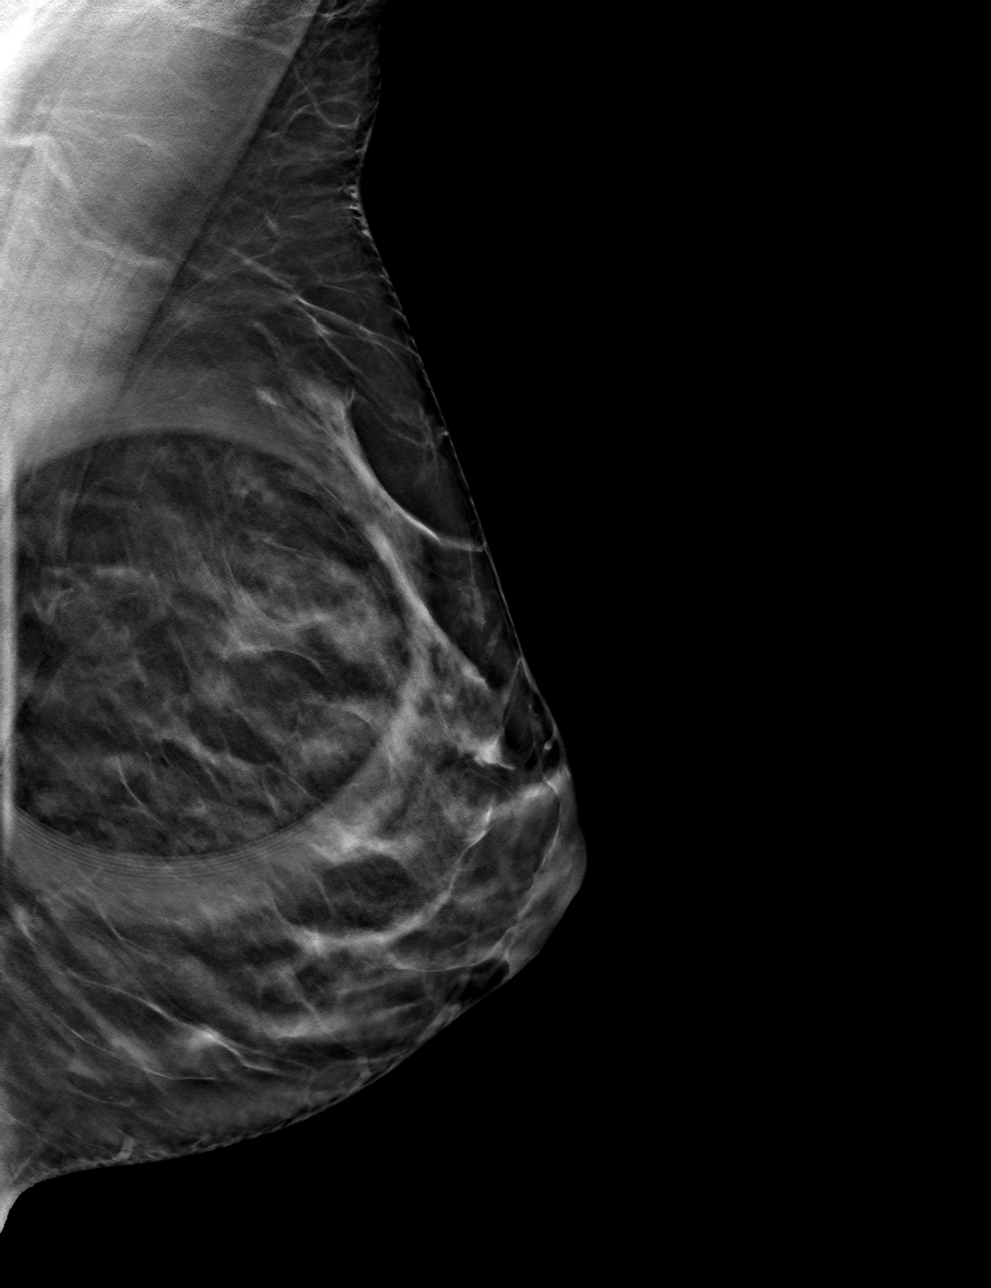

[L CC tomo · tomo slice 35/68.0]
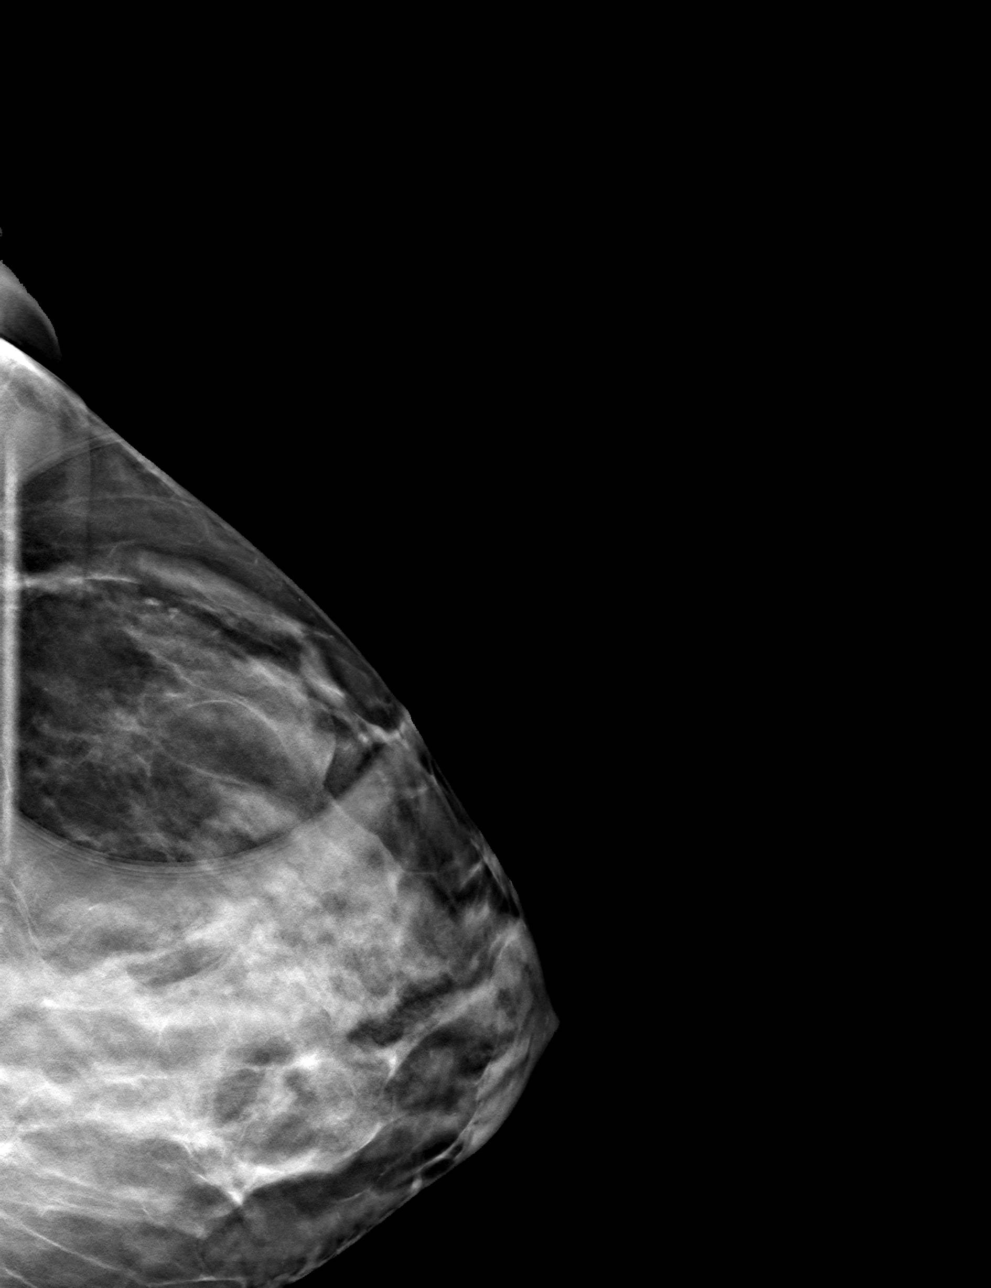

[4 of 12 positions shown; findings below may reference images not displayed]

ACR Breast Density Category c: The breast tissue is heterogeneously
dense, which may obscure small masses.
FINDINGS: Additional 2-D and 3-D images are performed. These views confirm
presence of a partially obscured mass in the LATERAL portion of the
LEFT breast.

Mammographic images were processed with CAD.

Targeted ultrasound is performed, showing a simple cyst in the 2:30
o'clock location of the LEFT breast 3 centimeters from the nipple
measuring 1.9 x 1.0 x 2.9 centimeters. An adjacent simple cyst is
1.4 x 0.8 x 0.5 centimeters.
IMPRESSION: Benign cysts in the LEFT breast. No mammographic or ultrasound
evidence for malignancy.

RECOMMENDATION:
Screening mammogram in one year.(Code:1I-X-VWU)

I have discussed the findings and recommendations with the patient.
If applicable, a reminder letter will be sent to the patient
regarding the next appointment.

BI-RADS CATEGORY  2: Benign.

## 2021-05-31 IMAGING — US US BREAST*L* LIMITED INC AXILLA
1 series · 7 of 7 positions shown · non-contrast
Comparison: Chest 16 20/20 and earlier

CLINICAL DATA: Patient returns after screening study for evaluation
of a possible LEFT breast mass.

EXAM:
DIGITAL DIAGNOSTIC LEFT MAMMOGRAM WITH CAD AND TOMO
ULTRASOUND LEFT BREAST

[Series 1: us breast*left* limited inc axilla · 0.07mm/px · 7 of 7 slices shown]
[im 1/7]
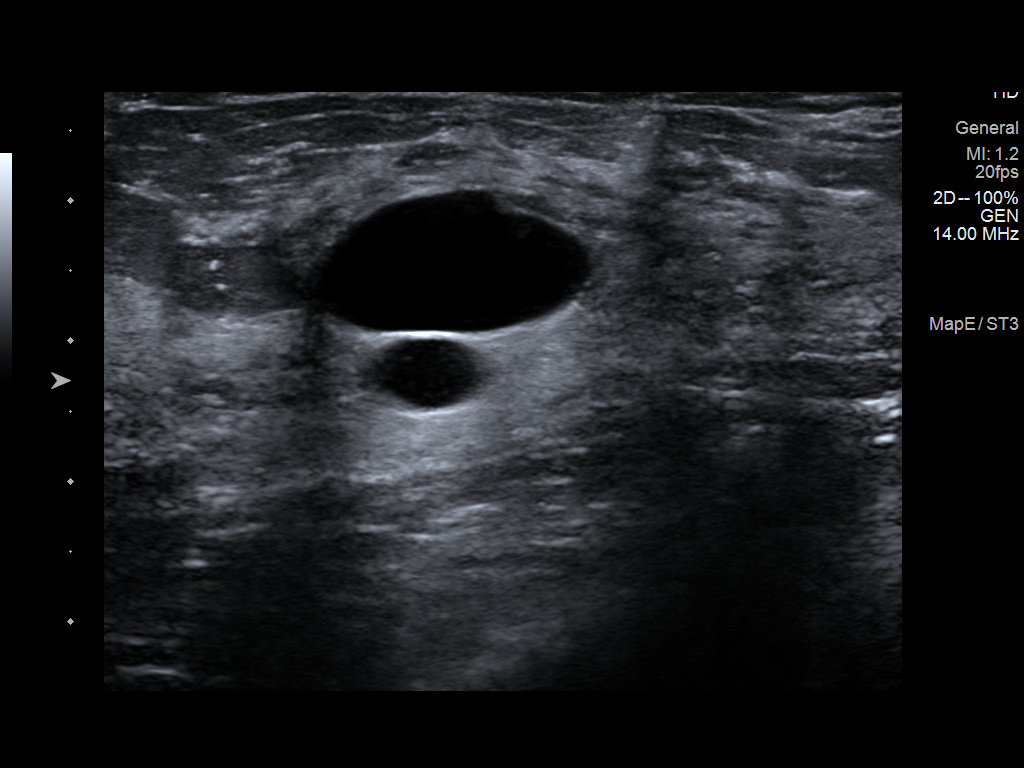
[im 2/7]
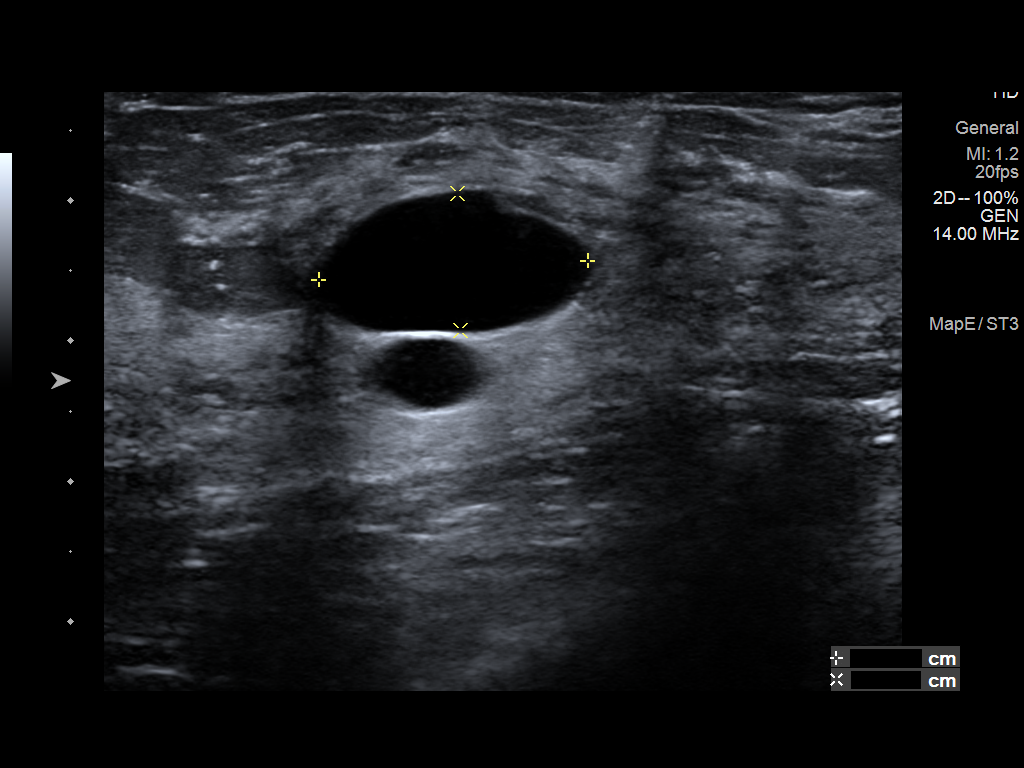
[im 3/7]
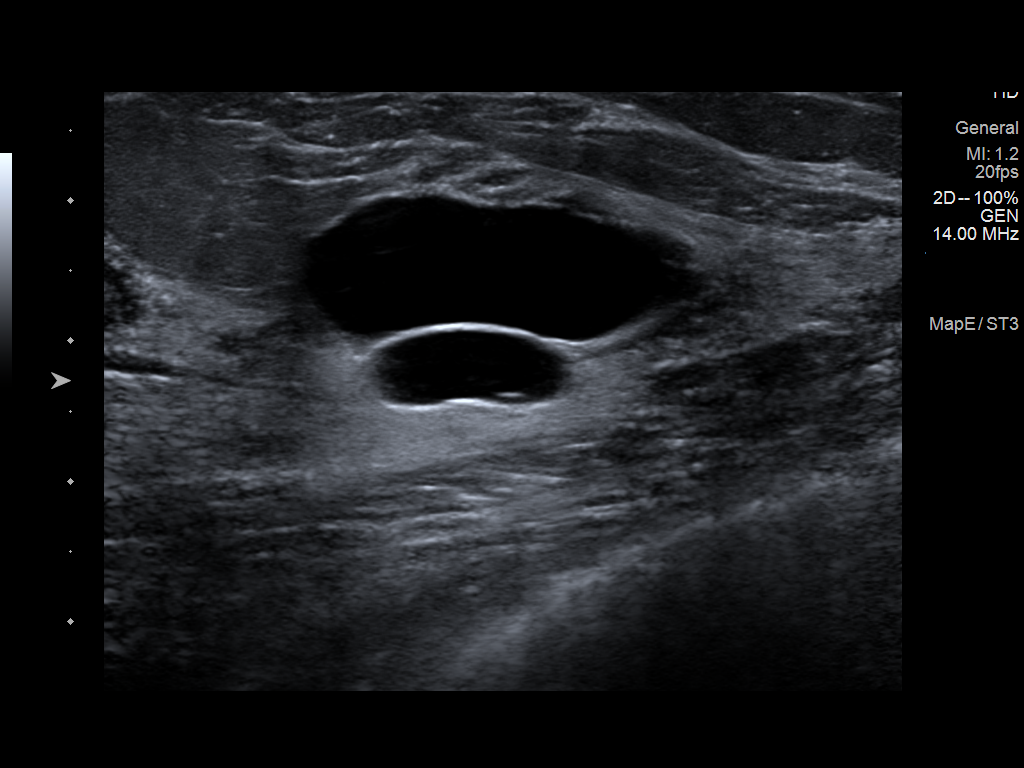
[im 4/7]
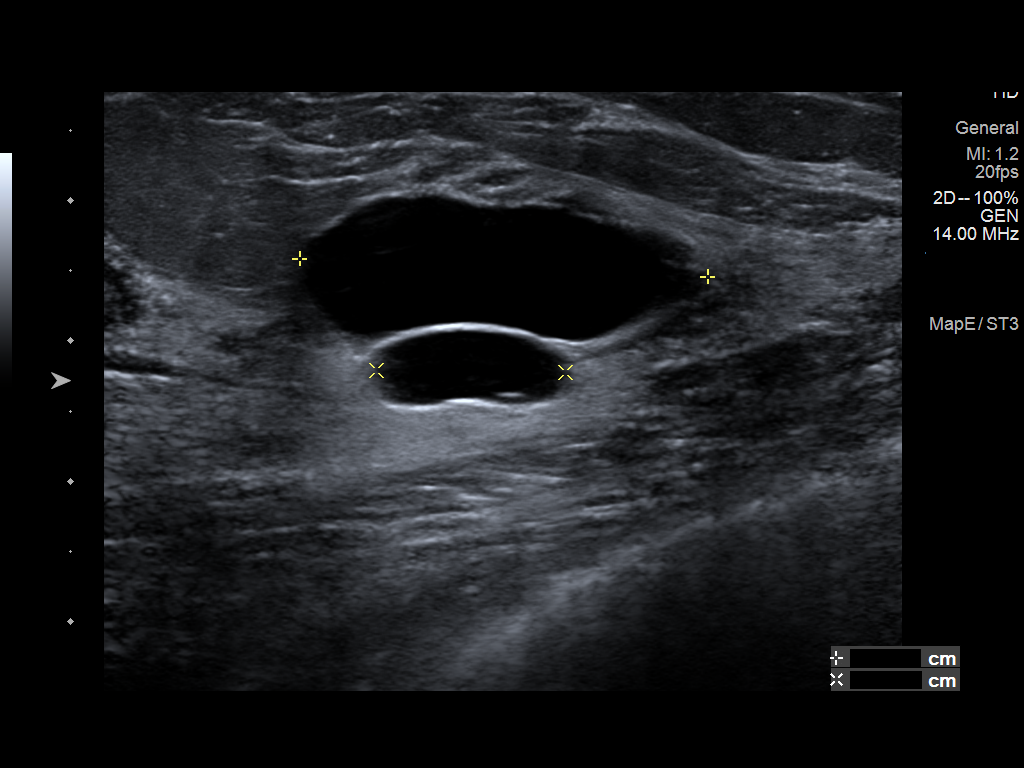
[im 5/7]
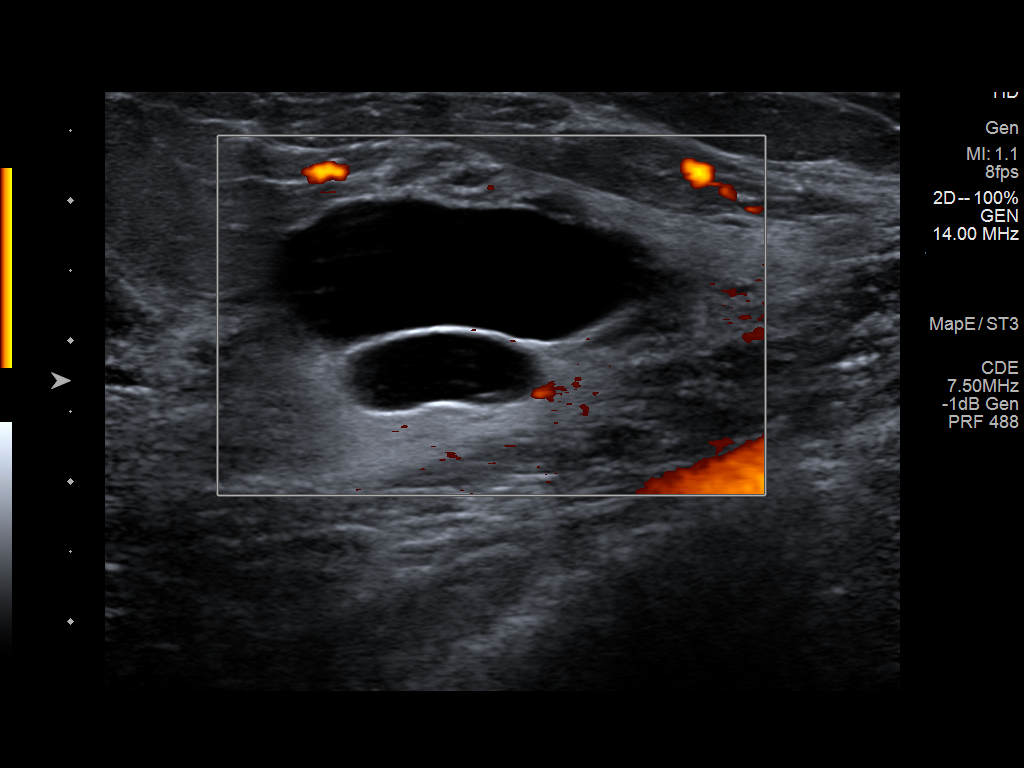
[im 6/7]
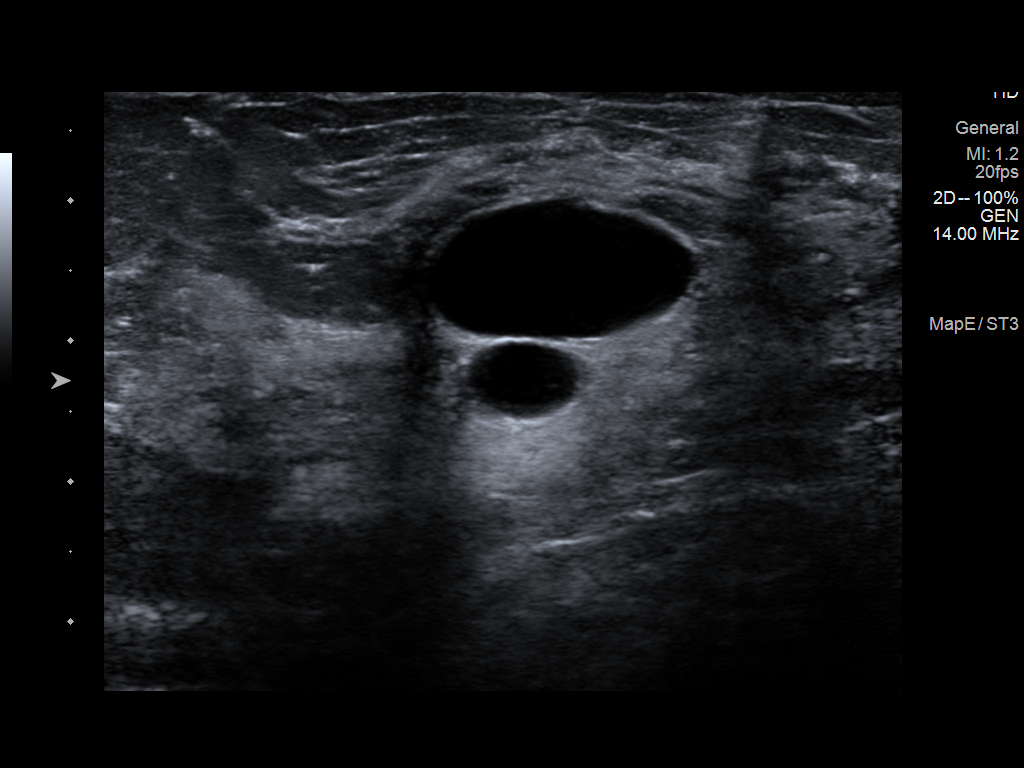
[im 7/7]
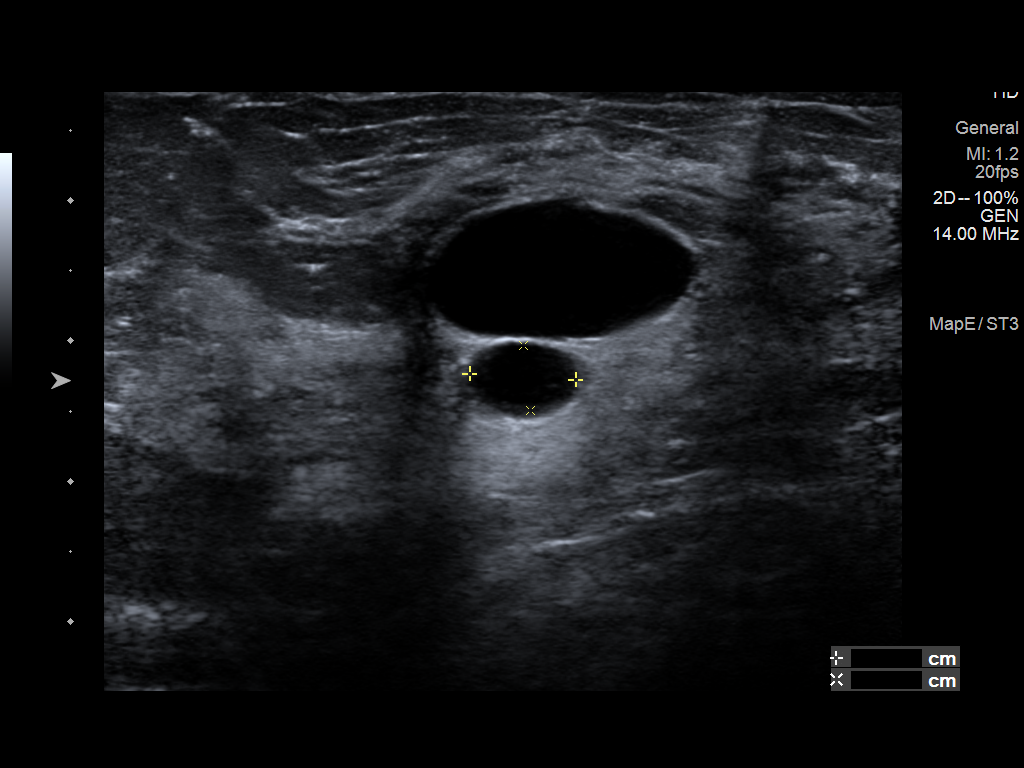

[7 of 7 positions shown; findings below may reference images not displayed]

ACR Breast Density Category c: The breast tissue is heterogeneously
dense, which may obscure small masses.
FINDINGS: Additional 2-D and 3-D images are performed. These views confirm
presence of a partially obscured mass in the LATERAL portion of the
LEFT breast.

Mammographic images were processed with CAD.

Targeted ultrasound is performed, showing a simple cyst in the 2:30
o'clock location of the LEFT breast 3 centimeters from the nipple
measuring 1.9 x 1.0 x 2.9 centimeters. An adjacent simple cyst is
1.4 x 0.8 x 0.5 centimeters.
IMPRESSION: Benign cysts in the LEFT breast. No mammographic or ultrasound
evidence for malignancy.

RECOMMENDATION:
Screening mammogram in one year.(Code:1I-[id number])

I have discussed the findings and recommendations with the patient.
If applicable, a reminder letter will be sent to the patient
regarding the next appointment.

BI-RADS CATEGORY  2: Benign.

## 2022-12-08 ENCOUNTER — Other Ambulatory Visit: Payer: Self-pay | Admitting: Obstetrics and Gynecology

## 2022-12-08 DIAGNOSIS — N6310 Unspecified lump in the right breast, unspecified quadrant: Secondary | ICD-10-CM

## 2022-12-22 ENCOUNTER — Other Ambulatory Visit: Payer: Self-pay | Admitting: Obstetrics and Gynecology

## 2022-12-22 ENCOUNTER — Ambulatory Visit
Admission: RE | Admit: 2022-12-22 | Discharge: 2022-12-22 | Disposition: A | Discharge status: Home / Self Care | Payer: BC Managed Care – PPO | Source: Ambulatory Visit | Attending: Obstetrics and Gynecology | Admitting: Obstetrics and Gynecology

## 2022-12-22 DIAGNOSIS — N6310 Unspecified lump in the right breast, unspecified quadrant: Secondary | ICD-10-CM

## 2024-03-01 ENCOUNTER — Encounter: Payer: Self-pay | Admitting: Advanced Practice Midwife
